# Patient Record
Sex: Female | Born: 1970 | Hispanic: Yes | Marital: Married
Health system: Southern US, Community
[De-identification: ages and names within clinical notes are randomized; demographics above are authoritative.]

---

## 2011-06-04 HISTORY — PX: BREAST BIOPSY: SHX20

## 2013-09-06 ENCOUNTER — Ambulatory Visit: Payer: Self-pay | Admitting: Family Medicine

## 2016-05-26 ENCOUNTER — Encounter: Payer: Self-pay | Admitting: *Deleted

## 2016-05-26 ENCOUNTER — Encounter (INDEPENDENT_AMBULATORY_CARE_PROVIDER_SITE_OTHER): Payer: Self-pay

## 2016-05-26 ENCOUNTER — Ambulatory Visit: Payer: Self-pay | Attending: Oncology | Admitting: *Deleted

## 2016-05-26 VITALS — BP 123/80 | HR 62 | Temp 98.2°F | Ht 62.21 in | Wt 161.9 lb

## 2016-05-26 DIAGNOSIS — N63 Unspecified lump in unspecified breast: Secondary | ICD-10-CM

## 2016-05-26 NOTE — Progress Notes (Signed)
Subjective:     Patient ID: Dionne BucyGuillermina C Dingledine, female   DOB: 11/08/1970, 46 y.o.   MRN: [id number]  HPI   Review of Systems     Objective:   Physical Exam  Pulmonary/Chest: Right breast exhibits no inverted nipple, no mass, no nipple discharge, no skin change and no tenderness. Left breast exhibits mass. Left breast exhibits no inverted nipple, no nipple discharge, no skin change and no tenderness. Breasts are symmetrical.         Assessment:     46 year old Hispanic [facility] and [facility]ogram [facility].  Maryjane HurterOtto, the interpreter present during the interview and exam.  Clinical breast exam with fibroglandular tissue in the upper outer quadrants of bilateral breast.  Patient states she has a nodule in the left axilla.  There is a palpable <0.5 cm mobile, firm, very superficial nodule in the left axilla.  The patient has excess mammary tissue bilaterally in the axilla.  Taught self breast awareness.  Patient has been screened for eligibility.  She does not have any insurance, Medicare or Medicaid.  She also meets financial eligibility.  Hand-out given on the Affordable Care Act.    Plan:     Bilateral diagnostic mammogram and ultrasound ordered.  Joellyn QuailsChristy Burton to schedule patient for her mammogram since she cannot be done today.  Will follow-up per [facility] protocol.

## 2016-06-01 ENCOUNTER — Telehealth: Payer: Self-pay | Admitting: *Deleted

## 2016-06-08 ENCOUNTER — Inpatient Hospital Stay
Admission: RE | Admit: 2016-06-08 | Discharge: 2016-06-08 | Disposition: A | Discharge status: Home / Self Care | Payer: Self-pay | Source: Ambulatory Visit | Attending: *Deleted | Admitting: *Deleted

## 2016-06-08 ENCOUNTER — Other Ambulatory Visit: Payer: Self-pay | Admitting: *Deleted

## 2016-06-08 DIAGNOSIS — Z9289 Personal history of other medical treatment: Secondary | ICD-10-CM

## 2016-06-14 ENCOUNTER — Telehealth: Payer: Self-pay | Admitting: *Deleted

## 2016-06-28 ENCOUNTER — Ambulatory Visit
Admission: RE | Admit: 2016-06-28 | Discharge: 2016-06-28 | Disposition: A | Discharge status: Home / Self Care | Payer: Self-pay | Source: Ambulatory Visit | Attending: Oncology | Admitting: Oncology

## 2016-06-28 ENCOUNTER — Encounter: Payer: Self-pay | Admitting: Radiology

## 2016-06-28 DIAGNOSIS — N63 Unspecified lump in unspecified breast: Secondary | ICD-10-CM

## 2016-07-13 ENCOUNTER — Encounter: Payer: Self-pay | Admitting: *Deleted

## 2016-07-13 NOTE — Progress Notes (Signed)
Patient with benign findings on mammogram.  Comparison was made to imaging from 2014 with the same left axillary nodule noting no change.  Patient informed of mammogram by radiology.  She is to follow up with annual screening in one year.  [facility] to [place].

## 2017-11-09 IMAGING — US US BREAST*L* LIMITED INC AXILLA
1 series · 6 of 6 positions shown · non-contrast
Comparison: Previous exam(s).

CLINICAL DATA: Short-term followup. This patient underwent imaging
at [REDACTED]. This was performed in June 2012. Two masses
in the right breast were discovered and subsequently biopsied
yielding concordant benign results. A palpable, 2.7 mm, superficial
ultrasound visualized mass was noted in the left axilla for which
short-term follow-up was recommended. This is the patient's first
follow-up exam. She reports no change in the small palpable left
axillary nodule.

EXAM:
2D DIGITAL DIAGNOSTIC BILATERAL MAMMOGRAM WITH CAD AND ADJUNCT TOMO
ULTRASOUND LEFT BREAST

[Series 1: us breast*left* limited inc axilla · 0.05mm/px · 6 of 6 slices shown]
[im 1/6]
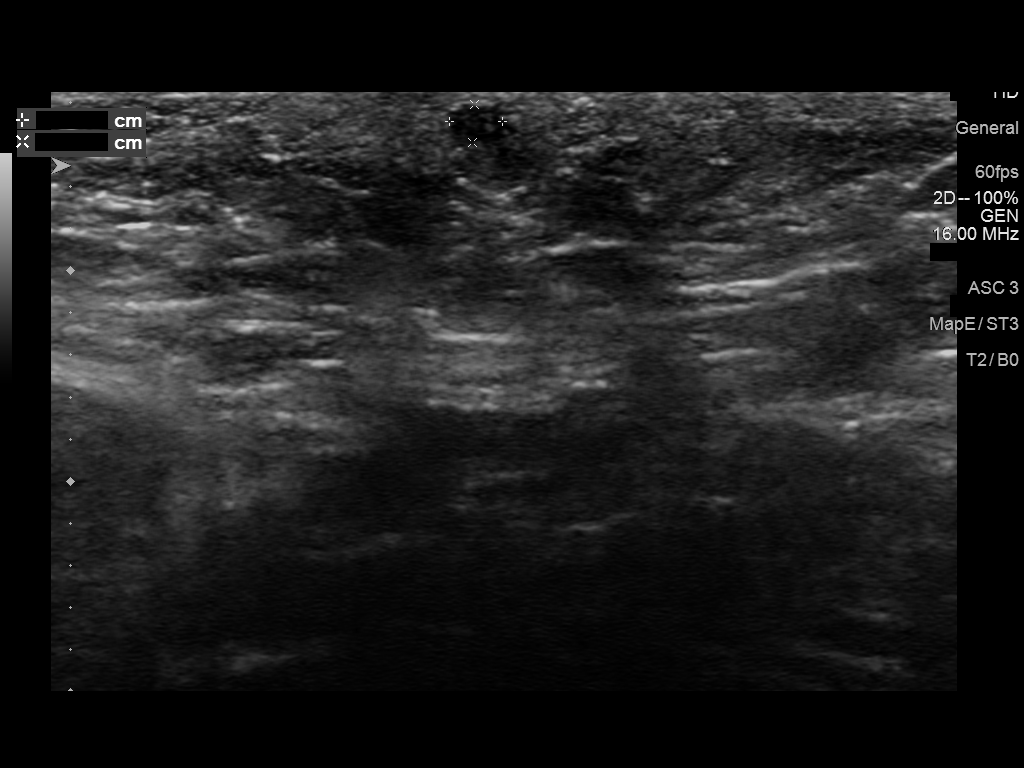
[im 2/6]
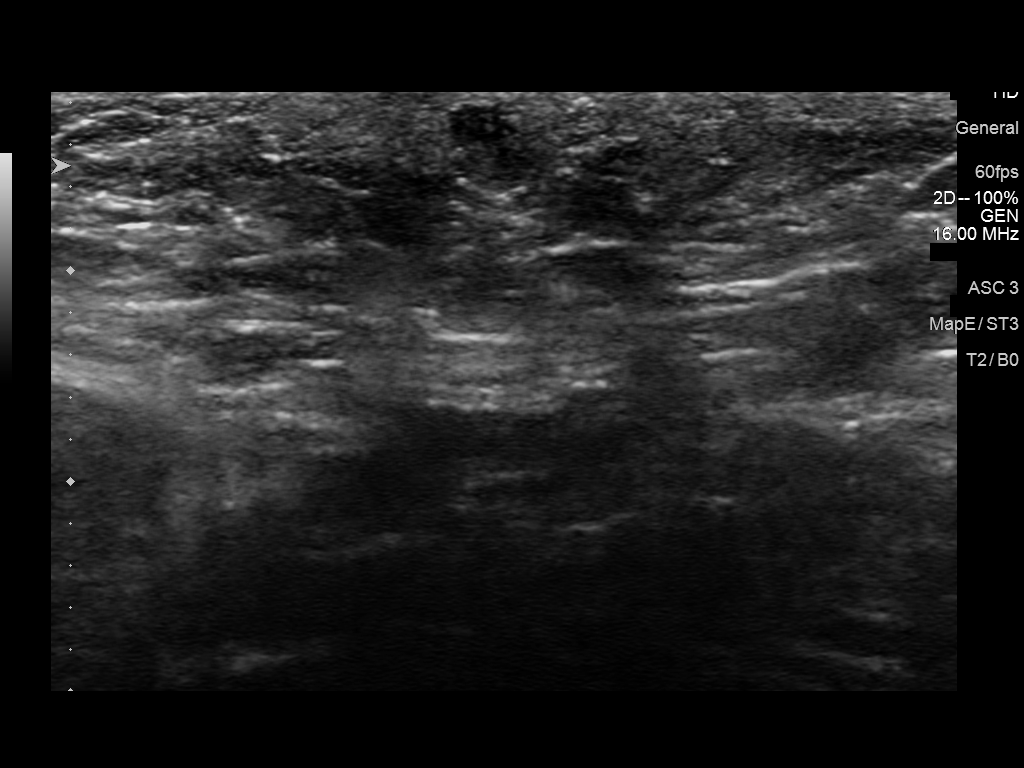
[im 3/6]
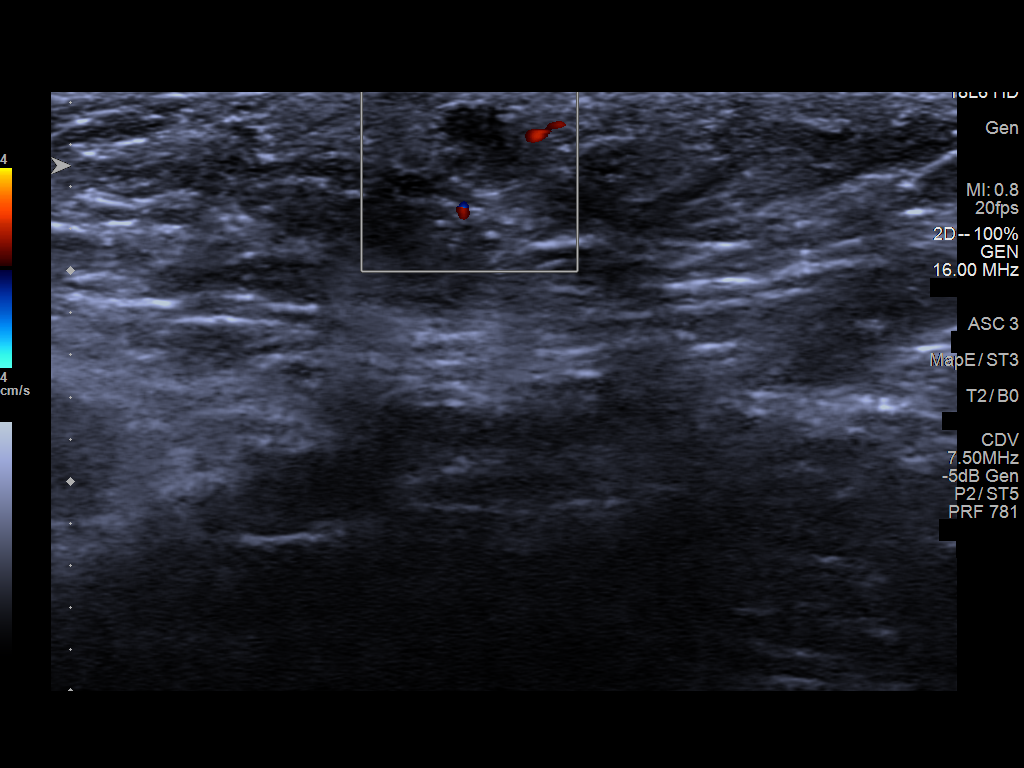
[im 4/6]
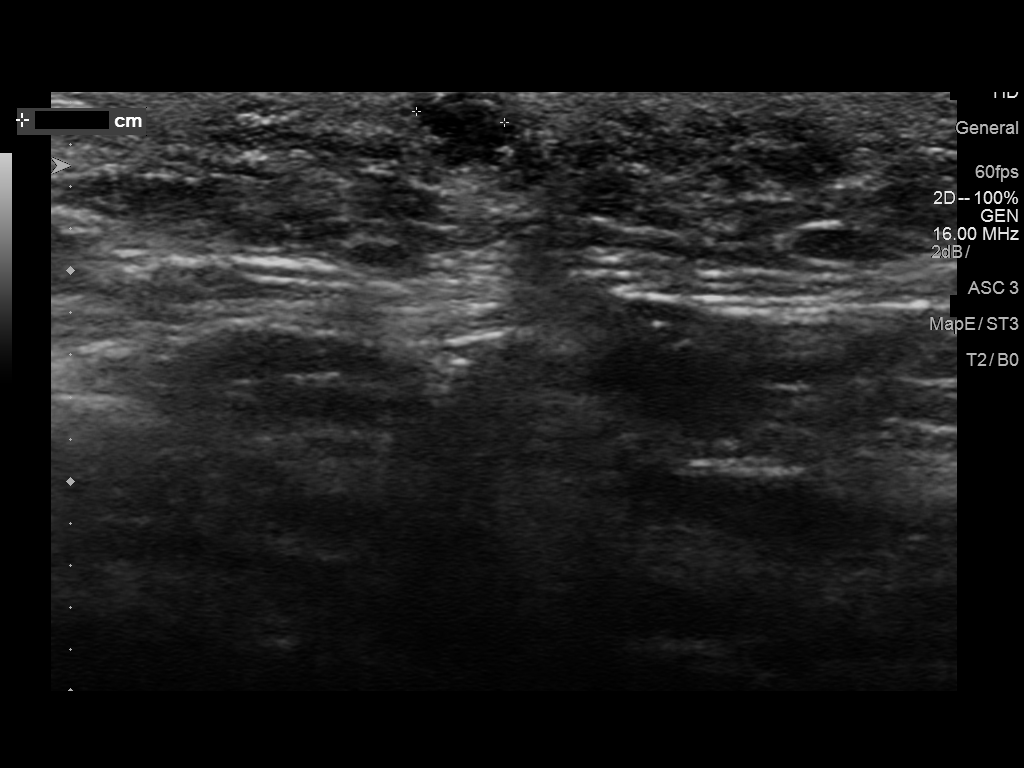
[im 5/6]
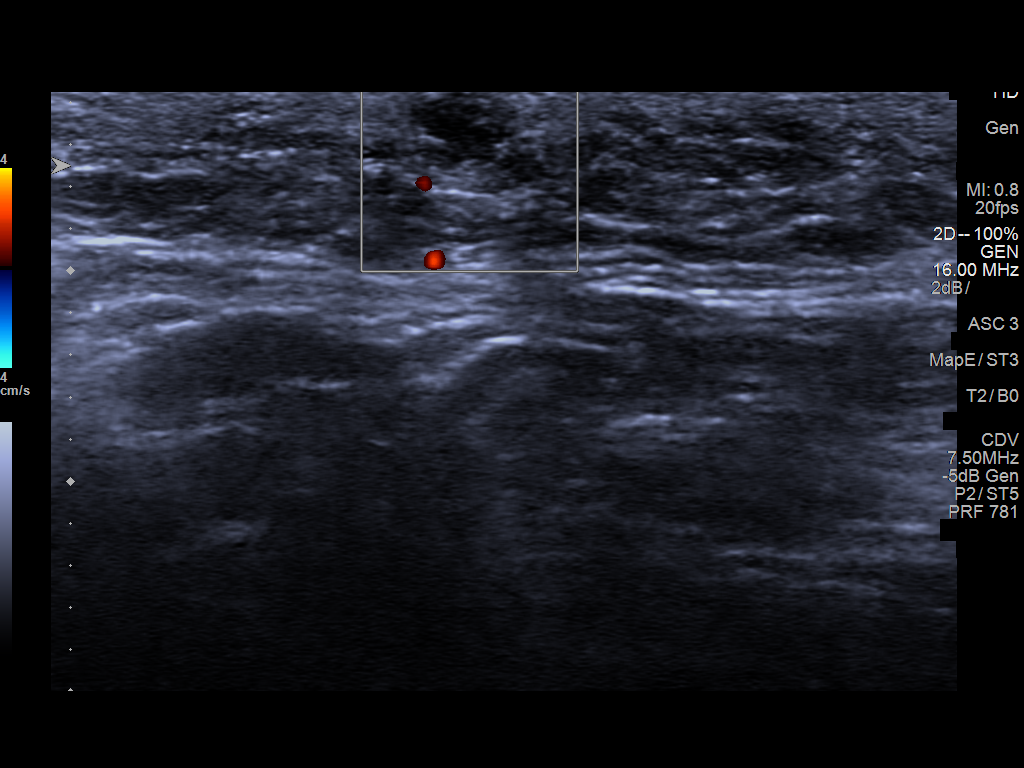
[im 6/6]
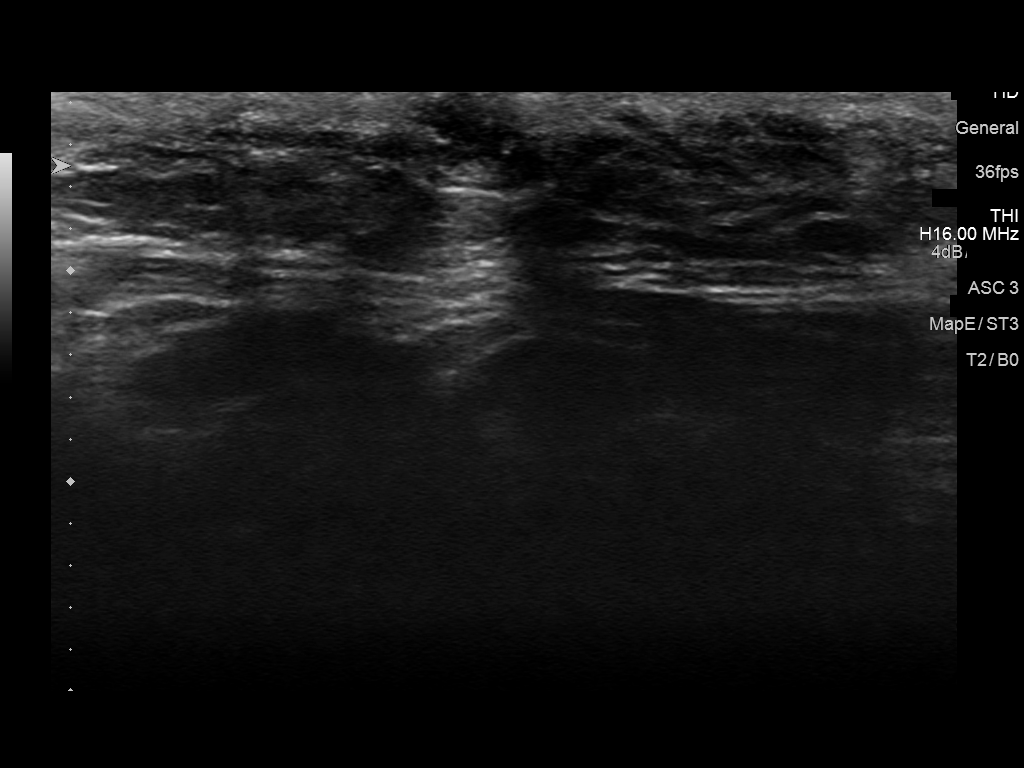

[6 of 6 positions shown; findings below may reference images not displayed]

ACR Breast Density Category c: The breast tissue is heterogeneously
dense, which may obscure small masses.
FINDINGS: Biopsy clips on the right reflect the previously biopsied benign
masses. There are no new masses in either breast. There are no areas
of architectural distortion. There are no suspicious calcifications.
No mammographic change from the most recent prior exam.

Mammographic images were processed with CAD.

On physical exam, there is a small superficial nodule in the left
axilla.

Targeted ultrasound is performed, showing a small hypoechoic oval
mass in the superficial left axilla, projecting within the deep
dermis, measuring 4 x 2 x 2.5 mm, without significant change from
the prior exam allowing for slight differences in measurement
technique. This is most likely a sebaceous cyst or skin inclusion
cyst.
IMPRESSION: 1. No evidence of malignancy.
2. 2 previously biopsied benign right breast masses.
3. Small benign palpable superficial nodule in the left axilla have
projects within the dermis most likely a skin inclusion cyst or
sebaceous cyst.

RECOMMENDATION:
Screening mammogram in one year.(Code:[id number])

I have discussed the findings and recommendations with the patient.
Results were also provided in writing at the conclusion of the
visit. If applicable, a reminder letter will be sent to the patient
regarding the next appointment.

BI-RADS CATEGORY  2: Benign.
